# Patient Record
Sex: Male | Born: 1990 | Race: Black or African American | Hispanic: No | Marital: Single | Smoking: Current every day smoker
Health system: Southern US, Community
[De-identification: ages and names within clinical notes are randomized; demographics above are authoritative.]

---

## 2014-10-23 ENCOUNTER — Encounter (HOSPITAL_COMMUNITY): Payer: Self-pay | Admitting: Emergency Medicine

## 2014-10-23 ENCOUNTER — Emergency Department (HOSPITAL_COMMUNITY)
Admission: EM | Admit: 2014-10-23 | Discharge: 2014-10-23 | Disposition: A | Discharge status: Home / Self Care | Payer: Self-pay | Attending: Emergency Medicine | Admitting: Emergency Medicine

## 2014-10-23 ENCOUNTER — Emergency Department (HOSPITAL_COMMUNITY): Payer: Self-pay

## 2014-10-23 DIAGNOSIS — Y9389 Activity, other specified: Secondary | ICD-10-CM | POA: Insufficient documentation

## 2014-10-23 DIAGNOSIS — S299XXA Unspecified injury of thorax, initial encounter: Secondary | ICD-10-CM | POA: Insufficient documentation

## 2014-10-23 DIAGNOSIS — Y92410 Unspecified street and highway as the place of occurrence of the external cause: Secondary | ICD-10-CM | POA: Insufficient documentation

## 2014-10-23 DIAGNOSIS — M545 Low back pain, unspecified: Secondary | ICD-10-CM

## 2014-10-23 DIAGNOSIS — S3992XA Unspecified injury of lower back, initial encounter: Secondary | ICD-10-CM | POA: Insufficient documentation

## 2014-10-23 DIAGNOSIS — Z720 Tobacco use: Secondary | ICD-10-CM | POA: Insufficient documentation

## 2014-10-23 DIAGNOSIS — M546 Pain in thoracic spine: Secondary | ICD-10-CM

## 2014-10-23 DIAGNOSIS — Y998 Other external cause status: Secondary | ICD-10-CM | POA: Insufficient documentation
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
## 2014-10-23 NOTE — Discharge Instructions (Signed)
Back Exercises Back exercises help treat and prevent back injuries. The goal of back exercises is to increase the strength of your abdominal and back muscles and the flexibility of your back. These exercises should be started when you no longer have back pain. Back exercises include:  Pelvic Tilt. Lie on your back with your knees bent. Tilt your pelvis until the lower part of your back is against the floor. Hold this position 5 to 10 sec and repeat 5 to 10 times.  Knee to Chest. Pull first 1 knee up against your chest and hold for 20 to 30 seconds, repeat this with the other knee, and then both knees. This may be done with the other leg straight or bent, whichever feels better.  Sit-Ups or Curl-Ups. Bend your knees 90 degrees. Start with tilting your pelvis, and do a partial, slow sit-up, lifting your trunk only 30 to 45 degrees off the floor. Take at least 2 to 3 seconds for each sit-up. Do not do sit-ups with your knees out straight. If partial sit-ups are difficult, simply do the above but with only tightening your abdominal muscles and holding it as directed.  Hip-Lift. Lie on your back with your knees flexed 90 degrees. Push down with your feet and shoulders as you raise your hips a couple inches off the floor; hold for 10 seconds, repeat 5 to 10 times.  Back arches. Lie on your stomach, propping yourself up on bent elbows. Slowly press on your hands, causing an arch in your low back. Repeat 3 to 5 times. Any initial stiffness and discomfort should lessen with repetition over time.  Shoulder-Lifts. Lie face down with arms beside your body. Keep hips and torso pressed to floor as you slowly lift your head and shoulders off the floor. Do not overdo your exercises, especially in the beginning. Exercises may cause you some mild back discomfort which lasts for a few minutes; however, if the pain is more severe, or lasts for more than 15 minutes, do not continue exercises until you see your caregiver.  Improvement with exercise therapy for back problems is slow.  See your caregivers for assistance with developing a proper back exercise program. Document Released: 04/09/2004 Document Revised: 05/25/2011 Document Reviewed: 01/01/2011 [facility] Patient Information 2015 [place], [place]. This information is not intended to replace advice given to you by your health care provider. Make sure you discuss any questions you have with your health care provider.  Please monitor for new or worsening signs or symptoms, return immediately if any present. Please avoid heavy lifting or activities that exacerbate your back pain. Please use ibuprofen or Tylenol as needed for pain, heat, ice, rest.
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
## 2014-12-30 ENCOUNTER — Emergency Department (HOSPITAL_COMMUNITY)
Admission: EM | Admit: 2014-12-30 | Discharge: 2014-12-31 | Disposition: A | Discharge status: Home / Self Care | Payer: Self-pay | Attending: Emergency Medicine | Admitting: Emergency Medicine

## 2014-12-30 ENCOUNTER — Encounter (HOSPITAL_COMMUNITY): Payer: Self-pay

## 2014-12-30 ENCOUNTER — Emergency Department (HOSPITAL_COMMUNITY): Payer: Self-pay

## 2014-12-30 DIAGNOSIS — S81831A Puncture wound without foreign body, right lower leg, initial encounter: Secondary | ICD-10-CM

## 2014-12-30 DIAGNOSIS — Y998 Other external cause status: Secondary | ICD-10-CM | POA: Insufficient documentation

## 2014-12-30 DIAGNOSIS — Y9289 Other specified places as the place of occurrence of the external cause: Secondary | ICD-10-CM | POA: Insufficient documentation

## 2014-12-30 DIAGNOSIS — Y9389 Activity, other specified: Secondary | ICD-10-CM | POA: Insufficient documentation

## 2014-12-30 DIAGNOSIS — S71101A Unspecified open wound, right thigh, initial encounter: Secondary | ICD-10-CM | POA: Insufficient documentation

## 2014-12-30 DIAGNOSIS — W3400XA Accidental discharge from unspecified firearms or gun, initial encounter: Secondary | ICD-10-CM | POA: Insufficient documentation

## 2014-12-30 LAB — COMPREHENSIVE METABOLIC PANEL
ALBUMIN: 4 g/dL (ref 3.5–5.0)
ALK PHOS: 54 U/L (ref 38–126)
ALT: 13 U/L — AB (ref 17–63)
AST: 24 U/L (ref 15–41)
Anion gap: 8 (ref 5–15)
BILIRUBIN TOTAL: 0.6 mg/dL (ref 0.3–1.2)
BUN: 14 mg/dL (ref 6–20)
CO2: 26 mmol/L (ref 22–32)
CREATININE: 1.31 mg/dL — AB (ref 0.61–1.24)
Calcium: 8.8 mg/dL — ABNORMAL LOW (ref 8.9–10.3)
Chloride: 100 mmol/L — ABNORMAL LOW (ref 101–111)
GFR calc Af Amer: >60 mL/min (ref >60)
Glucose, Bld: 120 mg/dL — ABNORMAL HIGH (ref 65–99)
Potassium: 3.4 mmol/L — ABNORMAL LOW (ref 3.5–5.1)
Sodium: 134 mmol/L — ABNORMAL LOW (ref 135–145)
TOTAL PROTEIN: 6.9 g/dL (ref 6.5–8.1)

## 2014-12-30 LAB — CBC
HCT: 41.8 % (ref 39.0–52.0)
Hemoglobin: 13.5 g/dL (ref 13.0–17.0)
MCH: 28.6 pg (ref 26.0–34.0)
MCHC: 32.3 g/dL (ref 30.0–36.0)
MCV: 88.6 fL (ref 78.0–100.0)
PLATELETS: 248 10*3/uL (ref 150–400)
RBC: 4.72 MIL/uL (ref 4.22–5.81)
RDW: 12.7 % (ref 11.5–15.5)
WBC: 11.8 10*3/uL — AB (ref 4.0–10.5)

## 2014-12-30 LAB — PROTIME-INR
INR: 1.06 (ref 0.00–1.49)
PROTHROMBIN TIME: 14 s (ref 11.6–15.2)

## 2014-12-30 LAB — ETHANOL: Alcohol, Ethyl (B): <5 mg/dL (ref <5)

## 2014-12-30 LAB — CDS SEROLOGY

## 2014-12-30 MED ORDER — SODIUM CHLORIDE 0.9 % IV SOLN
1000.0000 mL | Freq: Once | INTRAVENOUS | Status: AC
  Administered 2014-12-30: 1000 mL via INTRAVENOUS

## 2014-12-30 MED ORDER — HYDROMORPHONE HCL 1 MG/ML IJ SOLN
1.0000 mg | Freq: Once | INTRAMUSCULAR | Status: AC
  Administered 2014-12-30: 1 mg via INTRAVENOUS
  Filled 2014-12-30: qty 1

## 2014-12-30 MED ORDER — TETANUS-DIPHTH-ACELL PERTUSSIS 5-2.5-18.5 LF-MCG/0.5 IM SUSP
0.5000 mL | Freq: Once | INTRAMUSCULAR | Status: AC
  Administered 2014-12-30: 0.5 mL via INTRAMUSCULAR
  Filled 2014-12-30: qty 0.5

## 2014-12-30 MED ORDER — SODIUM CHLORIDE 0.9 % IV SOLN: 1000.0000 mL | INTRAVENOUS | Status: DC
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
## 2014-12-30 NOTE — ED Notes (Signed)
Femur xray completed
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
## 2014-12-31 ENCOUNTER — Encounter (HOSPITAL_COMMUNITY): Payer: Self-pay | Admitting: Emergency Medicine

## 2014-12-31 LAB — TYPE AND SCREEN
ABO/RH(D): O POS
Antibody Screen: NEGATIVE
Unit division: 0
Unit division: 0

## 2014-12-31 LAB — PREPARE FRESH FROZEN PLASMA
Unit division: 0
Unit division: 0

## 2014-12-31 LAB — ABO/RH: ABO/RH(D): O POS

## 2014-12-31 MED ORDER — HYDROCODONE-ACETAMINOPHEN 5-325 MG PO TABS: 1.0000 | ORAL_TABLET | ORAL | Status: AC | PRN
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
## 2015-04-08 ENCOUNTER — Emergency Department (HOSPITAL_COMMUNITY)
Admission: EM | Admit: 2015-04-08 | Discharge: 2015-04-08 | Disposition: A | Discharge status: Home / Self Care | Payer: Self-pay | Attending: Emergency Medicine | Admitting: Emergency Medicine

## 2015-04-08 ENCOUNTER — Encounter (HOSPITAL_COMMUNITY): Payer: Self-pay | Admitting: Family Medicine

## 2015-04-08 DIAGNOSIS — F17200 Nicotine dependence, unspecified, uncomplicated: Secondary | ICD-10-CM | POA: Insufficient documentation

## 2015-04-08 DIAGNOSIS — M79651 Pain in right thigh: Secondary | ICD-10-CM | POA: Insufficient documentation
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
## 2015-04-08 NOTE — ED Notes (Signed)
Call pt x2 with no answer.

## 2015-05-04 ENCOUNTER — Emergency Department (HOSPITAL_COMMUNITY)
Admission: EM | Admit: 2015-05-04 | Discharge: 2015-05-04 | Disposition: A | Discharge status: Home / Self Care | Payer: 59 | Attending: Emergency Medicine | Admitting: Emergency Medicine

## 2015-05-04 ENCOUNTER — Encounter (HOSPITAL_COMMUNITY): Payer: Self-pay | Admitting: *Deleted

## 2015-05-04 ENCOUNTER — Emergency Department (HOSPITAL_COMMUNITY): Payer: 59

## 2015-05-04 DIAGNOSIS — Z87828 Personal history of other (healed) physical injury and trauma: Secondary | ICD-10-CM | POA: Insufficient documentation

## 2015-05-04 DIAGNOSIS — M795 Residual foreign body in soft tissue: Secondary | ICD-10-CM

## 2015-05-04 DIAGNOSIS — J069 Acute upper respiratory infection, unspecified: Secondary | ICD-10-CM | POA: Diagnosis not present

## 2015-05-04 DIAGNOSIS — F17200 Nicotine dependence, unspecified, uncomplicated: Secondary | ICD-10-CM | POA: Insufficient documentation

## 2015-05-04 DIAGNOSIS — M79604 Pain in right leg: Secondary | ICD-10-CM | POA: Diagnosis present

## 2015-05-04 DIAGNOSIS — R609 Edema, unspecified: Secondary | ICD-10-CM

## 2015-05-04 MED ORDER — LIDOCAINE-EPINEPHRINE (PF) 2 %-1:200000 IJ SOLN
20.0000 mL | Freq: Once | INTRAMUSCULAR | Status: AC
  Administered 2015-05-04: 20 mL
  Filled 2015-05-04 (×2): qty 20

## 2015-05-04 MED ORDER — SULFAMETHOXAZOLE-TRIMETHOPRIM 800-160 MG PO TABS: 1.0000 | ORAL_TABLET | Freq: Two times a day (BID) | ORAL | Status: AC

## 2015-05-04 NOTE — Discharge Instructions (Signed)
Please return for evaluation in 3 days, any signs of infection present please return immediately.
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
## 2021-09-23 ENCOUNTER — Ambulatory Visit: Payer: Self-pay | Admitting: Podiatry

## 2024-03-07 ENCOUNTER — Emergency Department (HOSPITAL_COMMUNITY)
Admission: EM | Admit: 2024-03-07 | Discharge: 2024-03-07 | Disposition: A | Discharge status: Home / Self Care | Payer: Self-pay

## 2024-03-07 DIAGNOSIS — J069 Acute upper respiratory infection, unspecified: Secondary | ICD-10-CM | POA: Insufficient documentation

## 2024-03-07 LAB — RESP PANEL BY RT-PCR (RSV, FLU A&B, COVID)  RVPGX2
Influenza A by PCR: NEGATIVE
Influenza B by PCR: NEGATIVE
Resp Syncytial Virus by PCR: NEGATIVE
SARS Coronavirus 2 by RT PCR: NEGATIVE

## 2024-03-07 MED ORDER — BENZONATATE 100 MG PO CAPS: 100.0000 mg | ORAL_CAPSULE | Freq: Three times a day (TID) | ORAL | 0 refills | Status: AC | PRN

## 2024-03-07 NOTE — ED Triage Notes (Signed)
 Pt states that for one week he's been having URI symptoms including cough, congestion, body aches, fever. Pt with known sick contact with his child's mother who was diagnosed with the Flu one week ago.

## 2024-03-07 NOTE — ED Provider Notes (Signed)
 " [place] [facility] DEPARTMENT AT [facility] Provider Note   CSN: [id number] Arrival date & time: 03/07/24  1156     Patient presents with: URI   Randy Blankenship is a 33 y.o. male  who presents with 7 days of flulike symptoms including fever, chills, myalgias, nasal congestion, sore throat, and nonproductive cough.  The patient reports associated fatigue and decreased appetite, and he denies nausea, vomiting, or diarrhea.  Symptoms began gradually and have remained stable despite supportive care at home including antipyretics/over-the-counter cold and flu medication. The patient denies chest pain, shortness of breath, hemoptysis, syncope/near syncope, neck stiffness, or focal neurological deficits.  The patient reports sick contacts with similar symptoms in the home.  The patient is in no acute distress.    URI Presenting symptoms: cough        Prior to Admission medications  Medication Sig Start Date End Date Taking? Authorizing Provider  benzonatate  (TESSALON ) 100 MG capsule Take 1 capsule (100 mg total) by mouth 3 (three) times daily as needed for up to 7 days for cough. 03/07/24 03/14/24 Yes Daisy Mcneel L, PA  HYDROcodone -acetaminophen  (NORCO/VICODIN) 5-325 MG tablet Take 1-2 tablets by mouth every 4 (four) hours as needed. 12/31/14   Jillene Mann, MD    Allergies: Hepatitis b virus vaccines and Hepatitis b virus vaccines    Review of Systems  Respiratory:  Positive for cough.     Updated Vital Signs BP 120/80   Pulse (!) 105   Temp 99.6 F (37.6 C) (Oral)   Resp 16   SpO2 98%   Physical Exam Vitals and nursing note reviewed.  Constitutional:      General: He is awake. He is not in acute distress.    Appearance: Normal appearance. He is not toxic-appearing.  HENT:     Head: Normocephalic and atraumatic.     Right Ear: Hearing and ear canal normal. Tympanic membrane is erythematous.     Left Ear: Hearing and ear canal normal. Tympanic membrane is  erythematous.     Ears:     Comments: TM mildly erythematous bilaterally without bulging, effusion, purulence, or loss of landmarks.     Nose: Congestion and rhinorrhea present. Rhinorrhea is clear.     Right Sinus: Maxillary sinus tenderness present.     Left Sinus: Maxillary sinus tenderness present.     Mouth/Throat:     Mouth: Mucous membranes are moist.     Pharynx: Uvula midline. Posterior oropharyngeal erythema present. No pharyngeal swelling, oropharyngeal exudate or uvula swelling.     Tonsils: No tonsillar exudate or tonsillar abscesses.  Eyes:     General: Lids are normal. Vision grossly intact.     Extraocular Movements: Extraocular movements intact.     Conjunctiva/sclera: Conjunctivae normal.     Pupils: Pupils are equal, round, and reactive to light.  Cardiovascular:     Rate and Rhythm: Normal rate and regular rhythm.     Pulses: Normal pulses.          Radial pulses are 2+ on the right side.  Pulmonary:     Effort: Pulmonary effort is normal. No respiratory distress.     Breath sounds: Normal breath sounds. No wheezing.     Comments: Patient has no difficulty speaking in complete sentences.  Abdominal:     General: Abdomen is flat.     Palpations: Abdomen is soft.     Tenderness: There is no abdominal tenderness.  Musculoskeletal:  General: Normal range of motion.     Cervical back: Full passive range of motion without pain, normal range of motion and neck supple. No rigidity. No spinous process tenderness.  Lymphadenopathy:     Cervical: Cervical adenopathy present.  Skin:    General: Skin is warm and dry.     Capillary Refill: Capillary refill takes less than 2 seconds.     Findings: No rash.  Neurological:     General: No focal deficit present.     Mental Status: He is alert. Mental status is at baseline.  Psychiatric:        Attention and Perception: Attention normal.        Mood and Affect: Mood normal.        Speech: Speech normal.     (all  labs ordered are listed, but only abnormal results are displayed) Labs Reviewed  RESP PANEL BY RT-PCR (RSV, FLU A&B, COVID)  [id number]    EKG: None  Radiology: No results found.   Procedures   Medications Ordered in the ED - No data to display                                Medical Decision Making  Patient presents to the ED for: Flulike symptoms This involves an extensive number of treatment options Differential diagnosis includes:  Infectious etiology Co-morbid conditions: None  Clinical Course as of 03/07/24 2054  Tue Mar 07, 2024  1339 Temp: 99.6 F (37.6 C) Afebrile, vital stable, patient in no acute distress [ML]  1406 Resp panel by RT-PCR (RSV, Flu A&B, Covid) Anterior Nasal Swab Negative  [ML]    Clinical Course User Index [ML] Willma Duwaine CROME, PA    Data Reviewed / Actions Taken: Labs ordered/reviewed with my independent interpretation in ED course above.  Test Considered/Diagnostic tools:  Additional diagnostic testing such as further laboratory studies and imaging were considered, however, based on the patients presenting symptoms and initial clinical assessment they were deemed not necessary at this time.  ED Course / Reassessments: Problem List: Viral URI 33 year old male presented for flulike symptoms. Initial assessment included history, physical exam, and review of prior medical records. Laboratory testing was obtained given clinical presentation and viral PCR testing returned negative, however, given reassuring workup, patient's symptoms are likely viral in nature. There is low clinical suspicion for bacterial pathology requiring antibiotics at this time based on reassuring vital signs, physical exam findings, and overall clinical appearance.  Imaging and additional laboratory studies were not indicated. The patient remained stable during the ED course and was deemed appropriate for outpatient management.  Discharge planning include strict return precautions  for worsening respiratory symptoms, persistent high fevers, chest pain, inability to tolerate oral intake, or new neurological symptoms.  The patient was advised on continued supportive care at home, including hydration, rest, antipyretic use, and prescribed a short course of benzonatate  for cough.   Disposition: Disposition: Discharge with close follow-up with PCP for further evaluation and care. Rationale for disposition: Stable for discharge. The disposition plan and rationale were discussed with the patient at the bedside, all questions were addressed, and the patient demonstrated understanding.  This note was produced using [employer] Engineer. While I have reviewed and verified all clinical information, transcription errors may remain.      Final diagnoses:  Upper respiratory tract infection, unspecified type    ED Discharge Orders  Ordered    benzonatate  (TESSALON ) 100 MG capsule  3 times daily PRN        03/07/24 1409               Willma Duwaine CROME, [place] 03/07/24 2100    Neysa Caron PARAS, DO 03/08/24 [phone]  "

## 2024-03-07 NOTE — Discharge Instructions (Signed)
 Thank you for visiting the Emergency Department today. It was a pleasure to be part of your healthcare team.  Your workup was overall reassuring.  As discussed, you have been prescribed benzonatate  for cough - you should take your medications as directed. If you have any questions about your medicines, please call your pharmacy or healthcare provider. At home, rest, hydrate, resume normal diet as tolerated, and take Tylenol  and ibuprofen as needed for fever/pain relief. It is important to watch for warning signs such as worsening pain, fever, trouble breathing, or chest pain. If any of these happen, return to the Emergency Department or call 911. Thank you for trusting us  with your health.
